# Patient Record
Sex: Female | Born: 1965 | Race: White | Hispanic: No | Marital: Married | Smoking: Never smoker
Health system: Southern US, Community
[De-identification: ages and names within clinical notes are randomized; demographics above are authoritative.]

## PROBLEM LIST (undated history)

## (undated) DIAGNOSIS — F329 Major depressive disorder, single episode, unspecified: Secondary | ICD-10-CM

## (undated) DIAGNOSIS — I10 Essential (primary) hypertension: Secondary | ICD-10-CM

## (undated) DIAGNOSIS — K219 Gastro-esophageal reflux disease without esophagitis: Secondary | ICD-10-CM

## (undated) DIAGNOSIS — J45909 Unspecified asthma, uncomplicated: Secondary | ICD-10-CM

## (undated) DIAGNOSIS — B009 Herpesviral infection, unspecified: Secondary | ICD-10-CM

## (undated) DIAGNOSIS — F419 Anxiety disorder, unspecified: Secondary | ICD-10-CM

## (undated) DIAGNOSIS — K589 Irritable bowel syndrome without diarrhea: Secondary | ICD-10-CM

## (undated) DIAGNOSIS — F32A Depression, unspecified: Secondary | ICD-10-CM

## (undated) HISTORY — PX: UPPER GASTROINTESTINAL ENDOSCOPY: SHX188

## (undated) HISTORY — DX: Irritable bowel syndrome, unspecified: K58.9

## (undated) HISTORY — DX: Herpesviral infection, unspecified: B00.9

## (undated) HISTORY — PX: WISDOM TOOTH EXTRACTION: SHX21

## (undated) HISTORY — PX: TONSILLECTOMY: SUR1361

## (undated) HISTORY — DX: Gastro-esophageal reflux disease without esophagitis: K21.9

## (undated) HISTORY — DX: Essential (primary) hypertension: I10

## (undated) HISTORY — PX: BLEPHAROPLASTY: SUR158

## (undated) HISTORY — PX: BUNIONECTOMY: SHX129

## (undated) HISTORY — DX: Unspecified asthma, uncomplicated: J45.909

## (undated) HISTORY — DX: Anxiety disorder, unspecified: F41.9

## (undated) HISTORY — DX: Depression, unspecified: F32.A

---
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
## 2002-03-03 ENCOUNTER — Inpatient Hospital Stay (HOSPITAL_COMMUNITY): Admission: EM | Admit: 2002-03-03 | Discharge: 2002-03-04 | Payer: Self-pay | Admitting: Psychiatry

## 2002-03-07 ENCOUNTER — Other Ambulatory Visit (HOSPITAL_COMMUNITY): Admission: RE | Admit: 2002-03-07 | Discharge: 2002-03-08 | Payer: Self-pay | Admitting: Psychiatry

## 2015-07-30 ENCOUNTER — Institutional Professional Consult (permissible substitution): Payer: Self-pay | Admitting: Internal Medicine

## 2015-08-15 ENCOUNTER — Institutional Professional Consult (permissible substitution): Payer: Self-pay | Admitting: Internal Medicine

## 2015-09-25 ENCOUNTER — Other Ambulatory Visit: Payer: Self-pay | Admitting: Obstetrics and Gynecology

## 2015-09-25 ENCOUNTER — Ambulatory Visit
Admission: RE | Admit: 2015-09-25 | Discharge: 2015-09-25 | Disposition: A | Discharge status: Home / Self Care | Payer: No Typology Code available for payment source | Source: Ambulatory Visit | Attending: Obstetrics and Gynecology | Admitting: Obstetrics and Gynecology

## 2015-09-25 DIAGNOSIS — R0789 Other chest pain: Secondary | ICD-10-CM

## 2015-09-25 DIAGNOSIS — R0602 Shortness of breath: Secondary | ICD-10-CM

## 2015-09-25 MED ORDER — IOPAMIDOL (ISOVUE-370) INJECTION 76%
100.0000 mL | Freq: Once | INTRAVENOUS | Status: AC | PRN
  Administered 2015-09-25: 100 mL via INTRAVENOUS

## 2015-10-01 ENCOUNTER — Telehealth: Payer: Self-pay | Admitting: Cardiology

## 2015-10-01 NOTE — Telephone Encounter (Signed)
Received records from [facility] for appointment on 10/17/15 with Dr Stanford Breed.  Records given to [facility] (medical records) for Dr Jacalyn Lefevre schedule on 10/17/15. lp

## 2015-10-11 NOTE — Progress Notes (Signed)
Referring Dr Marylynn Pearson   HPI: 50 yo female for evaluation of chest pain, dyspnea and palpitations. CTA 1/17 showed no pulmonary embolus. No prior cardiac history. Over the past 6 months she has noticed dyspnea and chest tightness. Symptoms occur with exertion and stress. Also occasionally at rest. The tightness is substernal without radiation. Not pleuritic, positional or related to food. Last 2 hours and resolved spontaneously. Resolves with a glass of wine or Xanax. No associated nausea or diaphoresis. She denies fevers, chills or hemoptysis. She did have palpitations previously but these have resolved. She had an echocardiogram with her primary care that she states was normal. She also apparently had a monitor for 7 days that was unremarkable. I do not have those results available. She has been seen by pulmonary and diagnosed with asthma. She is also scheduled for EGD. Cardiology now asked to evaluate.  Current Outpatient Prescriptions  Medication Sig Dispense Refill  . BREO ELLIPTA 100-25 MCG/INH AEPB INL 1 PUFF PO INTO THE LUNGS D  11  . buPROPion (WELLBUTRIN XL) 300 MG 24 hr tablet TK 1 T PO  D UTD  12  . busPIRone (BUSPAR) 15 MG tablet Take 15 mg by mouth 2 (two) times daily.   3  . losartan (COZAAR) 50 MG tablet Take 50 mg by mouth daily.   7  . omeprazole (PRILOSEC) 40 MG capsule TK ONE C PO QD  5  . PROAIR HFA 108 (90 Base) MCG/ACT inhaler INL 2 PFS INTO THE LUNGS Q 6 H PRF WHZ  11  . TRINESSA, 28, 0.18/0.215/0.25 MG-35 MCG tablet TK 1 T PO QD UTD  12  . valACYclovir (VALTREX) 1000 MG tablet TK 1 T PO  BID UTD  12   No current facility-administered medications for this visit.    No Known Allergies   Past Medical History  Diagnosis Date  . Asthma   . IBS (irritable bowel syndrome)   . Hypertension   . GERD (gastroesophageal reflux disease)     Past Surgical History  Procedure Laterality Date  . Bunionectomy    . Tonsillectomy      Social History   Social History   . Marital Status: Married    Spouse Name: N/A  . Number of Children: N/A  . Years of Education: N/A   Occupational History  .      Futures trader   Social History Main Topics  . Smoking status: Never Smoker   . Smokeless tobacco: Not on file  . Alcohol Use: 0.0 oz/week    0 Standard drinks or equivalent per week     Comment: 1 glass per day  . Drug Use: No  . Sexual Activity: Not on file   Other Topics Concern  . Not on file   Social History Narrative  . No narrative on file    Family History  Problem Relation Age of Onset  . Heart disease Mother   . Heart attack Mother     MI at age 58  . Hypertension Sister   . Hyperlipidemia Sister     ROS: no fevers or chills, productive cough, hemoptysis, dysphasia, odynophagia, melena, hematochezia, dysuria, hematuria, rash, seizure activity, orthopnea, PND, pedal edema, claudication. Remaining systems are negative.  Physical Exam:   Blood pressure 137/85, pulse 78, height 5' 2.5" (1.588 m), weight 126 lb 12.8 oz (57.516 kg).  General:  Well developed/well nourished in NAD Skin warm/dry Patient not depressed No peripheral clubbing Back-normal HEENT-normal/normal eyelids Neck supple/normal carotid  upstroke bilaterally; no bruits; no JVD; no thyromegaly chest - CTA/ normal expansion CV - RRR/normal S1 and S2; no murmurs, rubs or gallops;  PMI nondisplaced Abdomen -NT/ND, no HSM, no mass, + bowel sounds, no bruit 2+ femoral pulses, no bruits Ext-no edema, chords, 2+ DP Neuro-grossly nonfocal  ECG Normal sinus rhythm, right axis deviation.

## 2015-10-17 ENCOUNTER — Encounter: Payer: Self-pay | Admitting: Cardiology

## 2015-10-17 ENCOUNTER — Ambulatory Visit (INDEPENDENT_AMBULATORY_CARE_PROVIDER_SITE_OTHER): Payer: No Typology Code available for payment source | Admitting: Cardiology

## 2015-10-17 VITALS — BP 137/85 | HR 78 | Ht 62.5 in | Wt 126.8 lb

## 2015-10-17 DIAGNOSIS — R0600 Dyspnea, unspecified: Secondary | ICD-10-CM

## 2015-10-17 DIAGNOSIS — I10 Essential (primary) hypertension: Secondary | ICD-10-CM | POA: Diagnosis not present

## 2015-10-17 DIAGNOSIS — R072 Precordial pain: Secondary | ICD-10-CM

## 2015-10-17 DIAGNOSIS — R002 Palpitations: Secondary | ICD-10-CM | POA: Diagnosis not present

## 2015-10-17 DIAGNOSIS — R079 Chest pain, unspecified: Secondary | ICD-10-CM | POA: Insufficient documentation

## 2015-10-17 NOTE — Assessment & Plan Note (Signed)
Blood pressure controlled. Continue present medications. 

## 2015-10-17 NOTE — Patient Instructions (Signed)
Medication Instructions:   NO CHANGE  Testing/Procedures:  Your physician has requested that you have a stress echocardiogram. For further information please visit [email or web address]. Please follow instruction sheet as given.   Follow-Up:  Your physician recommends that you schedule a follow-up appointment in: 6-8 WEEKS WITH DR Stanford Breed   Exercise Stress Echocardiogram An exercise stress echocardiogram is a heart (cardiac) test used to check the function of your heart. This test may also be called an exercise stress echocardiography or stress echo. This stress test will check how well your heart muscle and valves are working and determine if your heart muscle is getting enough blood. You will exercise on a treadmill to naturally increase or stress the functioning of your heart.  An echocardiogram uses sound waves (ultrasound) to produce an image of your heart. If your heart does not work normally, it may indicate coronary artery disease with poor coronary blood supply. The coronary arteries are the arteries that bring blood and oxygen to your heart. LET [facility] CARE PROVIDER KNOW ABOUT:  Any allergies you have.  All medicines you are taking, including vitamins, herbs, eye drops, creams, and over-the-counter medicines.  Previous problems you or members of your family have had with the use of anesthetics.  Any blood disorders you have.  Previous surgeries you have had.  Medical conditions you have.  Possibility of pregnancy, if this applies. RISKS AND COMPLICATIONS Generally, this is a safe procedure. However, as with any procedure, complications can occur. Possible complications can include:  You develop pain or pressure in the following areas:  Chest.  Jaw or neck.  Between your shoulder blades.  Radiating down your left arm.  Dizziness or lightheadedness.  Shortness of breath.  Increased or irregular heartbeat.  Nausea or vomiting.  Heart attack  (rare). BEFORE THE PROCEDURE  Avoid all forms of caffeine for 24 hours before your test or as directed by your health care provider. This includes coffee, tea (even decaffeinated tea), caffeinated sodas, chocolate, cocoa, and certain pain medicines.  Follow your health care provider's instructions regarding eating and drinking before the test.  Take your medicines as directed at regular times with water unless instructed otherwise. Exceptions may include:  If you have diabetes, ask how you are to take your insulin or pills. It is common to adjust insulin dosing the morning of the test.  If you are taking beta-blocker medicines, it is important to talk to your health care provider about these medicines well before the date of your test. Taking beta-blocker medicines may interfere with the test. In some cases, these medicines need to be changed or stopped 24 hours or more before the test.  If you wear a nitroglycerin patch, it may need to be removed prior to the test. Ask your health care provider if the patch should be removed before the test.  If you use an inhaler for any breathing condition, bring it with you to the test.  If you are an outpatient, bring a snack so you can eat right after the stress phase of the test.  Do not smoke for 4 hours prior to the test or as directed by your health care provider.  Wear loose-fitting clothes and comfortable shoes for the test. This test involves walking on a treadmill. PROCEDURE   Multiple electrodes will be put on your chest. If needed, small areas of your chest may be shaved to get better contact with the electrodes. Once the electrodes are attached to your body, multiple wires  will be attached to the electrodes, and your heart rate will be monitored.  You will have an echocardiogram done at rest.  To produce this image of your heart, gel is applied to your chest, and a wand-like tool (transducer) is moved over the chest. The transducer sends  the sound waves through the chest to create the moving images of your heart.  You may need an IV to receive a medication that improves the quality of the pictures.  You will then walk on a treadmill. The treadmill will be started at a slow pace. The treadmill speed and incline will gradually be increased to raise your heart rate.  At the peak of exercise, the treadmill will be stopped. You will lie down immediately on a bed so that a second echocardiogram can be done to visualize your heart's motion with exercise.  The test usually takes 30-60 minutes to complete. AFTER THE PROCEDURE  Your heart rate and blood pressure will be monitored after the test.  You may return to your normal schedule, including diet, activities, and medicines, unless your health care provider tells you otherwise.   This information is not intended to replace advice given to you by your health care provider. Make sure you discuss any questions you have with your health care provider.   Document Released: 08/22/2004 Document Revised: 08/23/2013 Document Reviewed: 04/25/2013 Elsevier Interactive Patient Education [employer].

## 2015-10-17 NOTE — Assessment & Plan Note (Signed)
Etiology unclear.Not volume overloaded on examination. Previous echo normal by her report. CTA showed no pulmonary embolus. She has been diagnosed with asthma.

## 2015-10-17 NOTE — Assessment & Plan Note (Signed)
Symptoms have resolved. No further workup at this point.

## 2015-10-17 NOTE — Assessment & Plan Note (Signed)
Symptoms have both typical and atypical features. We will arrange a stress echocardiogram for risk stratification.

## 2015-10-22 ENCOUNTER — Telehealth: Payer: Self-pay | Admitting: Cardiology

## 2015-10-22 NOTE — Telephone Encounter (Signed)
Faxed Release signed by patient to [facility] - Dr Christoper Fabian to obtain records per Dr Jacalyn Lefevre request.  Faxed on 10/22/15 to [phone]. lp

## 2015-10-23 ENCOUNTER — Ambulatory Visit: Payer: No Typology Code available for payment source | Admitting: Cardiology

## 2015-10-24 ENCOUNTER — Telehealth: Payer: Self-pay | Admitting: Cardiology

## 2015-10-24 NOTE — Telephone Encounter (Signed)
Received records from [facility] as requested by Dr Stanford Breed.  Patient has appt 12/05/15 with Dr Stanford Breed.  Records given to Dr Stanford Breed to review. lp

## 2015-11-06 ENCOUNTER — Encounter (HOSPITAL_BASED_OUTPATIENT_CLINIC_OR_DEPARTMENT_OTHER): Payer: No Typology Code available for payment source

## 2015-11-06 ENCOUNTER — Ambulatory Visit (HOSPITAL_COMMUNITY): Payer: No Typology Code available for payment source | Attending: Cardiology

## 2015-11-06 DIAGNOSIS — R0600 Dyspnea, unspecified: Secondary | ICD-10-CM | POA: Diagnosis not present

## 2015-11-06 DIAGNOSIS — R079 Chest pain, unspecified: Secondary | ICD-10-CM | POA: Diagnosis present

## 2015-11-06 DIAGNOSIS — R072 Precordial pain: Secondary | ICD-10-CM

## 2015-11-06 DIAGNOSIS — R002 Palpitations: Secondary | ICD-10-CM | POA: Diagnosis not present

## 2015-11-06 DIAGNOSIS — I10 Essential (primary) hypertension: Secondary | ICD-10-CM | POA: Insufficient documentation

## 2015-11-06 DIAGNOSIS — Z8249 Family history of ischemic heart disease and other diseases of the circulatory system: Secondary | ICD-10-CM | POA: Diagnosis not present

## 2015-11-06 DIAGNOSIS — R0989 Other specified symptoms and signs involving the circulatory and respiratory systems: Secondary | ICD-10-CM

## 2015-12-05 ENCOUNTER — Ambulatory Visit: Payer: No Typology Code available for payment source | Admitting: Cardiology

## 2018-01-13 ENCOUNTER — Other Ambulatory Visit: Payer: Self-pay | Admitting: Obstetrics and Gynecology

## 2018-01-13 DIAGNOSIS — Z803 Family history of malignant neoplasm of breast: Secondary | ICD-10-CM

## 2018-03-19 ENCOUNTER — Ambulatory Visit
Admission: RE | Admit: 2018-03-19 | Discharge: 2018-03-19 | Disposition: A | Discharge status: Home / Self Care | Payer: No Typology Code available for payment source | Source: Ambulatory Visit | Attending: Obstetrics and Gynecology | Admitting: Obstetrics and Gynecology

## 2018-03-19 DIAGNOSIS — Z803 Family history of malignant neoplasm of breast: Secondary | ICD-10-CM

## 2018-03-19 MED ORDER — GADOBENATE DIMEGLUMINE 529 MG/ML IV SOLN
10.0000 mL | Freq: Once | INTRAVENOUS | Status: AC | PRN
  Administered 2018-03-19: 10 mL via INTRAVENOUS

## 2018-09-19 ENCOUNTER — Other Ambulatory Visit: Payer: Self-pay

## 2018-09-19 ENCOUNTER — Emergency Department (HOSPITAL_BASED_OUTPATIENT_CLINIC_OR_DEPARTMENT_OTHER): Payer: No Typology Code available for payment source

## 2018-09-19 ENCOUNTER — Encounter (HOSPITAL_BASED_OUTPATIENT_CLINIC_OR_DEPARTMENT_OTHER): Payer: Self-pay | Admitting: Emergency Medicine

## 2018-09-19 ENCOUNTER — Emergency Department (HOSPITAL_BASED_OUTPATIENT_CLINIC_OR_DEPARTMENT_OTHER)
Admission: EM | Admit: 2018-09-19 | Discharge: 2018-09-19 | Disposition: A | Discharge status: Home / Self Care | Payer: No Typology Code available for payment source | Attending: Emergency Medicine | Admitting: Emergency Medicine

## 2018-09-19 DIAGNOSIS — I10 Essential (primary) hypertension: Secondary | ICD-10-CM | POA: Insufficient documentation

## 2018-09-19 DIAGNOSIS — J45909 Unspecified asthma, uncomplicated: Secondary | ICD-10-CM | POA: Insufficient documentation

## 2018-09-19 DIAGNOSIS — Z79899 Other long term (current) drug therapy: Secondary | ICD-10-CM | POA: Insufficient documentation

## 2018-09-19 DIAGNOSIS — R1031 Right lower quadrant pain: Secondary | ICD-10-CM | POA: Diagnosis not present

## 2018-09-19 LAB — CBC WITH DIFFERENTIAL/PLATELET
Abs Immature Granulocytes: 0.03 10*3/uL (ref 0.00–0.07)
Basophils Absolute: 0 10*3/uL (ref 0.0–0.1)
Basophils Relative: 0 %
Eosinophils Absolute: 0 10*3/uL (ref 0.0–0.5)
Eosinophils Relative: 0 %
HCT: 41.6 % (ref 36.0–46.0)
Hemoglobin: 13.5 g/dL (ref 12.0–15.0)
IMMATURE GRANULOCYTES: 0 %
Lymphocytes Relative: 16 %
Lymphs Abs: 1.6 10*3/uL (ref 0.7–4.0)
MCH: 33.1 pg (ref 26.0–34.0)
MCHC: 32.5 g/dL (ref 30.0–36.0)
MCV: 102 fL — ABNORMAL HIGH (ref 80.0–100.0)
Monocytes Absolute: 0.5 10*3/uL (ref 0.1–1.0)
Monocytes Relative: 5 %
NEUTROS PCT: 79 %
NRBC: 0 % (ref 0.0–0.2)
Neutro Abs: 8 10*3/uL — ABNORMAL HIGH (ref 1.7–7.7)
Platelets: 275 10*3/uL (ref 150–400)
RBC: 4.08 MIL/uL (ref 3.87–5.11)
RDW: 13.2 % (ref 11.5–15.5)
WBC: 10.2 10*3/uL (ref 4.0–10.5)

## 2018-09-19 LAB — COMPREHENSIVE METABOLIC PANEL
ALT: 20 U/L (ref 0–44)
AST: 29 U/L (ref 15–41)
Albumin: 4.2 g/dL (ref 3.5–5.0)
Alkaline Phosphatase: 48 U/L (ref 38–126)
Anion gap: 12 (ref 5–15)
BUN: 12 mg/dL (ref 6–20)
CO2: 20 mmol/L — ABNORMAL LOW (ref 22–32)
Calcium: 8.9 mg/dL (ref 8.9–10.3)
Chloride: 105 mmol/L (ref 98–111)
Creatinine, Ser: 0.75 mg/dL (ref 0.44–1.00)
Glucose, Bld: 87 mg/dL (ref 70–99)
Potassium: 3.5 mmol/L (ref 3.5–5.1)
Sodium: 137 mmol/L (ref 135–145)
Total Bilirubin: 0.7 mg/dL (ref 0.3–1.2)
Total Protein: 7.1 g/dL (ref 6.5–8.1)

## 2018-09-19 LAB — URINALYSIS, ROUTINE W REFLEX MICROSCOPIC
Bilirubin Urine: NEGATIVE
Glucose, UA: NEGATIVE mg/dL
Hgb urine dipstick: NEGATIVE
Ketones, ur: NEGATIVE mg/dL
Leukocytes, UA: NEGATIVE
Nitrite: NEGATIVE
PROTEIN: NEGATIVE mg/dL
Specific Gravity, Urine: <1.005 — ABNORMAL LOW (ref 1.005–1.030)
pH: 6 (ref 5.0–8.0)

## 2018-09-19 LAB — PREGNANCY, URINE: Preg Test, Ur: NEGATIVE

## 2018-09-19 LAB — LIPASE, BLOOD: Lipase: 31 U/L (ref 11–51)

## 2018-09-19 MED ORDER — DICYCLOMINE HCL 10 MG PO CAPS
20.0000 mg | ORAL_CAPSULE | Freq: Once | ORAL | Status: AC
  Administered 2018-09-19: 20 mg via ORAL
  Filled 2018-09-19: qty 2

## 2018-09-19 MED ORDER — ONDANSETRON HCL 4 MG/2ML IJ SOLN
4.0000 mg | Freq: Once | INTRAMUSCULAR | Status: AC
  Administered 2018-09-19: 4 mg via INTRAVENOUS
  Filled 2018-09-19: qty 2

## 2018-09-19 MED ORDER — DICYCLOMINE HCL 20 MG PO TABS: 20.0000 mg | ORAL_TABLET | Freq: Two times a day (BID) | ORAL | 0 refills | Status: DC

## 2018-09-19 MED ORDER — IOPAMIDOL (ISOVUE-300) INJECTION 61%
100.0000 mL | Freq: Once | INTRAVENOUS | Status: AC | PRN
  Administered 2018-09-19: 100 mL via INTRAVENOUS

## 2018-09-19 MED ORDER — MORPHINE SULFATE (PF) 4 MG/ML IV SOLN
4.0000 mg | Freq: Once | INTRAVENOUS | Status: AC
  Administered 2018-09-19: 4 mg via INTRAVENOUS
  Filled 2018-09-19: qty 1

## 2018-09-19 MED ORDER — SODIUM CHLORIDE 0.9 % IV BOLUS
1000.0000 mL | Freq: Once | INTRAVENOUS | Status: AC
  Administered 2018-09-19: 1000 mL via INTRAVENOUS

## 2018-09-19 NOTE — Discharge Instructions (Signed)
Unclear cause of your pain but your work-up today has been very reassuring, CT scan did not show any acute changes and labs overall look good.  We were not able to view your ovaries with pelvic ultrasound due to large amounts of bowel gas, this gas could be causing her discomfort I would like for you to use Bentyl and over-the-counter gas reducers and see if this helps with your discomfort if pain is not improving please follow-up with your primary care doctor and if pain worsens or changes you have persistent vomiting, fevers or any other new or concerning symptoms do not hesitate to return to the emergency department for reevaluation.

## 2018-09-19 NOTE — ED Triage Notes (Signed)
Pt c/o right lower abdominal pain and diarrhea onset 2 am this morning. Pt seen at urgent care and sent here for further eval.

## 2018-09-19 NOTE — ED Provider Notes (Signed)
[place] EMERGENCY DEPARTMENT Provider Note   CSN: [id number] Arrival date & time: 09/19/18  1035     History   Chief Complaint Chief Complaint  Patient presents with  . Abdominal Pain  . Diarrhea    HPI Pamela Holt is a 53 y.o. female.  Pamela Holt is a 53 y.o. female with a history of IBS, hypertension, GERD and asthma, who presents to the emergency department for evaluation of right lower quadrant abdominal pain which started suddenly at 2 AM this morning.  She describes the pain as a sharp sudden onset pain that comes and goes frequently it does not last for more than a few seconds.  She is never had similar pain like this before.  Reports she had a few episodes of loose stools but has not had any nausea or vomiting.  No blood in the stool.  No fevers or chills.  Denies any dysuria or urinary frequency.  Denies any vaginal bleeding or discharge.  Reports she is on birth control and in a monogamous relationship with her husband.  She has taken some of her typical IBS medications to see if it would help with this pain but nothing seems to make the pain better or worse.  No other aggravating or alleviating factors.  No prior abdominal surgeries.  No history of kidney stones.  Patient was initially evaluated at urgent care this morning but sent to the emergency department for further evaluation.      Past Medical History:  Diagnosis Date  . Asthma   . GERD (gastroesophageal reflux disease)   . Hypertension   . IBS (irritable bowel syndrome)     Patient Active Problem List   Diagnosis Date Noted  . Chest pain 10/17/2015  . Dyspnea 10/17/2015  . Palpitations 10/17/2015  . Essential hypertension 10/17/2015    Past Surgical History:  Procedure Laterality Date  . BUNIONECTOMY    . TONSILLECTOMY       OB History   No obstetric history on file.      Home Medications    Prior to Admission medications   Medication Sig Start Date End  Date Taking? Authorizing Provider  BREO ELLIPTA 100-25 MCG/INH AEPB INL 1 PUFF PO INTO THE LUNGS D 08/30/15   [provider]  buPROPion (WELLBUTRIN XL) 300 MG 24 hr tablet TK 1 T PO  D UTD 09/24/15   [provider]  busPIRone (BUSPAR) 15 MG tablet Take 15 mg by mouth 2 (two) times daily.  09/24/15   [provider]  losartan (COZAAR) 50 MG tablet Take 50 mg by mouth daily.  09/24/15   [provider]  omeprazole (PRILOSEC) 40 MG capsule TK ONE C PO QD 09/26/15   [provider]  PROAIR HFA 108 (90 Base) MCG/ACT inhaler INL 2 PFS INTO THE LUNGS Q 6 H PRF WHZ 08/30/15   [provider]  TRINESSA, 28, 0.18/0.215/0.25 MG-35 MCG tablet TK 1 T PO QD UTD 09/26/15   [provider]  valACYclovir (VALTREX) 1000 MG tablet TK 1 T PO  BID UTD 09/24/15   [provider]    Family History Family History  Problem Relation Age of Onset  . Heart disease Mother   . Heart attack Mother        MI at age 55  . Hypertension Sister   . Hyperlipidemia Sister     Social History Social History   Tobacco Use  . Smoking status: Never Smoker  . Smokeless  tobacco: Never Used  Substance Use Topics  . Alcohol use: Yes    Alcohol/week: 0.0 standard drinks    Comment: 1 glass per day  . Drug use: No     Allergies   Patient has no known allergies.   Review of Systems Review of Systems  Constitutional: Negative for chills and fever.  HENT: Negative.   Eyes: Negative for visual disturbance.  Respiratory: Negative for cough and shortness of breath.   Cardiovascular: Negative for chest pain.  Gastrointestinal: Positive for abdominal distention and diarrhea. Negative for constipation, nausea and vomiting.  Genitourinary: Negative for dysuria, flank pain, frequency, hematuria, vaginal bleeding and vaginal discharge.  Musculoskeletal: Negative for arthralgias and myalgias.  Skin: Negative for color change and rash.  Neurological: Negative  for dizziness, syncope and light-headedness.     Physical Exam Updated Vital Signs BP (!) 171/94 (BP Location: Right Arm)   Pulse 96   Temp 98.4 F (36.9 C) (Oral)   Resp 18   Ht 5\' 2"  (1.575 m)   Wt 106.6 kg   LMP  (LMP Unknown)   SpO2 100%   BMI 42.98 kg/m   Physical Exam Vitals signs and nursing note reviewed.  Constitutional:      General: She is not in acute distress.    Appearance: She is well-developed and normal weight. She is not ill-appearing, toxic-appearing or diaphoretic.  HENT:     Head: Normocephalic and atraumatic.     Mouth/Throat:     Mouth: Mucous membranes are moist.     Pharynx: Oropharynx is clear.  Eyes:     General:        Right eye: No discharge.        Left eye: No discharge.  Cardiovascular:     Rate and Rhythm: Normal rate and regular rhythm.     Heart sounds: Normal heart sounds. No murmur. No friction rub. No gallop.   Pulmonary:     Effort: Pulmonary effort is normal. No respiratory distress.     Breath sounds: Normal breath sounds.     Comments: Respirations equal and unlabored, patient able to speak in full sentences, lungs clear to auscultation bilaterally Abdominal:     General: Abdomen is flat. Bowel sounds are normal. There is no distension.     Palpations: Abdomen is soft.     Tenderness: There is no abdominal tenderness. There is no right CVA tenderness, left CVA tenderness or guarding. Negative signs include McBurney's sign.     Hernia: No hernia is present.     Comments: Abdomen is soft, nondistended, bowel sounds present throughout, there is no reproducible tenderness in the right lower quadrant or throughout the rest of the abdomen, no guarding, rebound tenderness or peritoneal signs. No CVA tenderness bilaterally  Skin:    General: Skin is warm and dry.     Capillary Refill: Capillary refill takes less than 2 seconds.  Neurological:     Mental Status: She is alert and oriented to person, place, and time.     Coordination:  Coordination normal.  Psychiatric:        Behavior: Behavior normal.      ED Treatments / Results  Labs (all labs ordered are listed, but only abnormal results are displayed) Labs Reviewed  URINALYSIS, ROUTINE W REFLEX MICROSCOPIC - Abnormal; Notable for the following components:      Result Value   Specific Gravity, Urine <1.005 (*)    All other components within normal limits  COMPREHENSIVE METABOLIC PANEL -  Abnormal; Notable for the following components:   CO2 20 (*)    All other components within normal limits  CBC WITH DIFFERENTIAL/PLATELET - Abnormal; Notable for the following components:   MCV 102.0 (*)    Neutro Abs 8.0 (*)    All other components within normal limits  PREGNANCY, URINE  LIPASE, BLOOD    EKG None  Radiology US Transvaginal Non-ob  Result Date: 09/19/2018 CLINICAL DATA:  Right lower quadrant pain EXAM: TRANSABDOMINAL AND TRANSVAGINAL ULTRASOUND OF PELVIS TECHNIQUE: Both transabdominal and transvaginal ultrasound examinations of the pelvis were performed. Transabdominal technique was performed for global imaging of the pelvis including uterus, ovaries, adnexal regions, and pelvic cul-de-sac. It was necessary to proceed with endovaginal exam following the transabdominal exam to visualize the ovaries. COMPARISON:  CT from earlier in the same day. FINDINGS: Uterus Measurements: 8.4 x 3.7 x 4.2 cm. = volume: 68 mL. No fibroids or other mass visualized. Endometrium Thickness: 7 mm.  No focal abnormality visualized. Right ovary Not visualized Left ovary Not visualized Other findings No abnormal free fluid. IMPRESSION: Nonvisualization of the ovaries. No acute abnormality is noted. Electronically Signed   By: Inez Catalina M.D.   On: 09/19/2018 15:20   US Pelvis Complete  Result Date: 09/19/2018 CLINICAL DATA:  Right lower quadrant pain EXAM: TRANSABDOMINAL AND TRANSVAGINAL ULTRASOUND OF PELVIS TECHNIQUE: Both transabdominal and transvaginal ultrasound examinations  of the pelvis were performed. Transabdominal technique was performed for global imaging of the pelvis including uterus, ovaries, adnexal regions, and pelvic cul-de-sac. It was necessary to proceed with endovaginal exam following the transabdominal exam to visualize the ovaries. COMPARISON:  CT from earlier in the same day. FINDINGS: Uterus Measurements: 8.4 x 3.7 x 4.2 cm. = volume: 68 mL. No fibroids or other mass visualized. Endometrium Thickness: 7 mm.  No focal abnormality visualized. Right ovary Not visualized Left ovary Not visualized Other findings No abnormal free fluid. IMPRESSION: Nonvisualization of the ovaries. No acute abnormality is noted. Electronically Signed   By: Inez Catalina M.D.   On: 09/19/2018 15:20   Ct Abdomen Pelvis W Contrast  Result Date: 09/19/2018 CLINICAL DATA:  Abdominal pain.  Rule out appendicitis EXAM: CT ABDOMEN AND PELVIS WITH CONTRAST TECHNIQUE: Multidetector CT imaging of the abdomen and pelvis was performed using the standard protocol following bolus administration of intravenous contrast. CONTRAST:  162mL ISOVUE-300 IOPAMIDOL (ISOVUE-300) INJECTION 61% COMPARISON:  None. FINDINGS: Lower chest: Lung bases clear bilaterally Hepatobiliary: No focal liver abnormality is seen. No gallstones, gallbladder wall thickening, or biliary dilatation. Pancreas: Negative Spleen: Negative Adrenals/Urinary Tract: Adrenal glands are unremarkable. Kidneys are normal, without renal calculi, focal lesion, or hydronephrosis. Bladder is unremarkable. Stomach/Bowel: Negative for bowel obstruction. Edema in the gastric antrum could be due to gastritis or incomplete distention. Appendix not visualized.  No evidence of acute appendicitis Vascular/Lymphatic: No significant vascular findings are present. No enlarged abdominal or pelvic lymph nodes. Reproductive: Normal uterus.  No pelvic mass Other: Negative for free fluid.  Negative for hernia Musculoskeletal: No acute skeletal abnormality. Mild disc  degeneration L3-4 L4-5. IMPRESSION: 1. Appendix not visualized.  No evidence of acute appendicitis 2. Thickening of the gastric antrum possible gastritis. Question epigastric pain Electronically Signed   By: Franchot Gallo M.D.   On: 09/19/2018 13:21    Procedures Procedures (including critical care time)  Medications Ordered in ED Medications  sodium chloride 0.9 % bolus 1,000 mL (0 mLs Intravenous Stopped 09/19/18 1440)  ondansetron (ZOFRAN) injection 4 mg (4 mg Intravenous Given 09/19/18  1217)  morphine 4 MG/ML injection 4 mg (4 mg Intravenous Given 09/19/18 1217)  iopamidol (ISOVUE-300) 61 % injection 100 mL (100 mLs Intravenous Contrast Given 09/19/18 1247)  dicyclomine (BENTYL) capsule 20 mg (20 mg Oral Given 09/19/18 1529)     Initial Impression / Assessment and Plan / ED Course  I have reviewed the triage vital signs and the nursing notes.  Pertinent labs & imaging results that were available during my care of the patient were reviewed by me and considered in my medical decision making (see chart for details).  Patient presents to the emergency department for evaluation of right lower quadrant abdominal pain which began suddenly at 2 AM.  Pains are intermittent, sharp and brief.  On arrival patient is hypertensive but all other vitals normal and she appears comfortable and is in no acute distress, not experiencing pain at time of my exam and abdominal exam with no reproducible tenderness, no guarding or rebound and no CVA tenderness.  Patient was sent from urgent care for further evaluation of pain with concern for possible appendicitis.  My differential includes appendicitis, diverticulitis, patient does have history of IBS and pain could be related to this, versus ovarian torsion although patient has no history of ovarian cysts.  Will start with basic abdominal labs and CT abdomen pelvis.  Fluids, Zofran and morphine given for symptomatic management.  Work-up overall reassuring, no  leukocytosis, normal hemoglobin, no acute electrolyte derangements requiring intervention, normal renal liver function and normal lipase.  Negative pregnancy, urinalysis without hematuria to suggest infection or kidney stone and no other signs of infection.  CT abdomen pelvis without any evidence of appendicitis or other acute intra-abdominal pathology, there is some thickening of the gastric antrum but patient is not having any epigastric pain or tenderness on exam and is not experiencing any signs of gastritis.  No other acute findings within the abdomen or pelvis.  Given that patient is still experiencing this intermittent pain will proceed with pelvic ultrasound to assess for any ovarian cyst although I feel that torsion is unlikely since pain is intermittent and brief rather than constant and worsening.  Pelvic ultrasound on not able to visualize ovaries due to large amounts of bowel gas but no other acute findings noted within the pelvis, normal uterus.  Again I have low suspicion for ovarian torsion given that pain is brief and intermittent and given the amount of bowel gas the ultrasound tech described I feel that this may be contributing to patient's symptoms.  Will treat with Bentyl and I have encouraged patient to use gas reducer such as simethicone or Gas-X and have patient follow-up closely with her primary care doctor if symptoms are not improving, strict return precautions discussed.  Patient expresses understanding and agreement with plan.  Final Clinical Impressions(s) / ED Diagnoses   Final diagnoses:  Right lower quadrant abdominal pain    ED Discharge Orders         Ordered    dicyclomine (BENTYL) 20 MG tablet  2 times daily     09/19/18 1534           Benedetto Goad [place], [place] 09/22/18 [id number]    Virgel Manifold, MD 09/24/18 [phone]

## 2019-01-31 ENCOUNTER — Encounter: Payer: Self-pay | Admitting: Gastroenterology

## 2019-02-14 ENCOUNTER — Ambulatory Visit (AMBULATORY_SURGERY_CENTER): Payer: Self-pay | Admitting: *Deleted

## 2019-02-14 ENCOUNTER — Other Ambulatory Visit: Payer: Self-pay

## 2019-02-14 VITALS — Ht 62.0 in | Wt 125.0 lb

## 2019-02-14 DIAGNOSIS — Z1211 Encounter for screening for malignant neoplasm of colon: Secondary | ICD-10-CM

## 2019-02-14 MED ORDER — SUPREP BOWEL PREP KIT 17.5-3.13-1.6 GM/177ML PO SOLN: 1.0000 | Freq: Once | ORAL | 0 refills | Status: AC

## 2019-02-14 NOTE — Progress Notes (Signed)
Patient denies any allergies to egg or soy products. Patient denies complications with anesthesia/sedation.  Patient denies oxygen use at home and denies diet medications.   Pt verified name, DOB, address and insurance during PV today. Pt mailed instruction packet to included paper to complete and mail back to [facility] with addressed and stamped envelope, Emmi video, copy of consent form to read and not return, and instructions. Suprep coupon mailed in packet. PV completed over the phone. Pt encouraged to call with questions or issues after reviewing the packet information.

## 2019-02-17 ENCOUNTER — Encounter: Payer: Self-pay | Admitting: Gastroenterology

## 2019-02-21 ENCOUNTER — Telehealth: Payer: Self-pay

## 2019-02-21 NOTE — Telephone Encounter (Signed)
Called patient and left message on voice mail confirming instructions for amount of Suprep to take in divided doses.( 6oz. Evening before procedure and 6oz the morning of the Colonscopy)

## 2019-02-24 ENCOUNTER — Telehealth: Payer: Self-pay | Admitting: Gastroenterology

## 2019-02-24 NOTE — Telephone Encounter (Signed)
Left message to call back to ask Covid-19 screening questions. °Covid-19 Screening Questions: ° °Do you now or have you had a fever in the last 14 days?  ° °Do you have any respiratory symptoms of shortness of breath or cough now or in the last 14 days?  ° °Do you have any family members or close contacts with diagnosed or suspected Covid-19 in the past 14 days?  ° °Have you been tested for Covid-19 and found to be positive?  ° °Pt made aware of that care partner may wait in the car or come up to the lobby during the procedure but will need to provide their own mask. °

## 2019-02-24 NOTE — Telephone Encounter (Signed)
Pt returned call:  Covid-19 Screening Questions   Do you now or have you had a fever in the last 14 days?     No   Do you have any respiratory symptoms of shortness of breath or cough now or in the last 14 days?    No   Do you have any family members or close contacts with diagnosed or suspected Covid-19 in the past 14 days?  No   Have you been tested for Covid-19 and found to be positive?    No   Pt made aware of that care partner may come to the lobby during the procedure but will need to provide their own mask.

## 2019-02-25 ENCOUNTER — Other Ambulatory Visit: Payer: Self-pay

## 2019-02-25 ENCOUNTER — Ambulatory Visit (AMBULATORY_SURGERY_CENTER): Payer: No Typology Code available for payment source | Admitting: Gastroenterology

## 2019-02-25 ENCOUNTER — Encounter: Payer: Self-pay | Admitting: Gastroenterology

## 2019-02-25 VITALS — BP 117/75 | HR 75 | Temp 98.5°F | Resp 15 | Ht 62.25 in | Wt 126.0 lb

## 2019-02-25 DIAGNOSIS — D124 Benign neoplasm of descending colon: Secondary | ICD-10-CM | POA: Diagnosis not present

## 2019-02-25 DIAGNOSIS — Z1211 Encounter for screening for malignant neoplasm of colon: Secondary | ICD-10-CM | POA: Diagnosis not present

## 2019-02-25 MED ORDER — CILIDINIUM-CHLORDIAZEPOXIDE 2.5-5 MG PO CAPS: 1.0000 | ORAL_CAPSULE | Freq: Every day | ORAL | 0 refills | Status: AC | PRN

## 2019-02-25 MED ORDER — SODIUM CHLORIDE 0.9 % IV SOLN: 500.0000 mL | Freq: Once | INTRAVENOUS | Status: DC

## 2019-02-25 NOTE — Patient Instructions (Signed)
YOU HAD AN ENDOSCOPIC PROCEDURE TODAY AT THE [facility]:   Refer to the procedure report that was given to you for any specific questions about what was found during the examination.  If the procedure report does not answer your questions, please call your gastroenterologist to clarify.  If you requested that your care partner not be given the details of your procedure findings, then the procedure report has been included in a sealed envelope for you to review at your convenience later.  YOU SHOULD EXPECT: Some feelings of bloating in the abdomen. Passage of more gas than usual.  Walking can help get rid of the air that was put into your GI tract during the procedure and reduce the bloating. If you had a lower endoscopy (such as a colonoscopy or flexible sigmoidoscopy) you may notice spotting of blood in your stool or on the toilet paper. If you underwent a bowel prep for your procedure, you may not have a normal bowel movement for a few days.  Please Note:  You might notice some irritation and congestion in your nose or some drainage.  This is from the oxygen used during your procedure.  There is no need for concern and it should clear up in a day or so.  SYMPTOMS TO REPORT IMMEDIATELY:   Following lower endoscopy (colonoscopy or flexible sigmoidoscopy):  Excessive amounts of blood in the stool  Significant tenderness or worsening of abdominal pains  Swelling of the abdomen that is new, acute  Fever of 100F or higher   For urgent or emergent issues, a gastroenterologist can be reached at any hour by calling [phone].   DIET:  We do recommend a small meal at first, but then you may proceed to your regular diet.  Drink plenty of fluids but you should avoid alcoholic beverages for 24 hours.  MEDICATIONS: Continue present medications.  Please see handouts given to you by your recovery nurse.  ACTIVITY:  You should plan to take it easy for the rest of today and you should  NOT DRIVE or use heavy machinery until tomorrow (because of the sedation medicines used during the test).    FOLLOW UP: Our staff will call the number listed on your records 48-72 hours following your procedure to check on you and address any questions or concerns that you may have regarding the information given to you following your procedure. If we do not reach you, we will leave a message.  We will attempt to reach you two times.  During this call, we will ask if you have developed any symptoms of COVID 19. If you develop any symptoms (ie: fever, flu-like symptoms, shortness of breath, cough etc.) before then, please call [phone].  If you test positive for Covid 19 in the 2 weeks post procedure, please call and report this information to us.    If any biopsies were taken you will be contacted by phone or by letter within the next 1-3 weeks.  Please call us at [phone] if you have not heard about the biopsies in 3 weeks.   Thank you for allowing us to provide for your healthcare needs today.   SIGNATURES/CONFIDENTIALITY: You and/or your care partner have signed paperwork which will be entered into your electronic medical record.  These signatures attest to the fact that that the information above on your After Visit Summary has been reviewed and is understood.  Full responsibility of the confidentiality of this discharge information lies with you and/or   your care-partner. 

## 2019-02-25 NOTE — Progress Notes (Signed)
Called to room to assist during endoscopic procedure.  Patient ID and intended procedure confirmed with present staff. Received instructions for my participation in the procedure from the performing physician.  

## 2019-02-25 NOTE — Progress Notes (Signed)
Pt's states no medical or surgical changes since previsit or office visit.  Temp CW Vitals JB 

## 2019-02-25 NOTE — Progress Notes (Signed)
Pt drowsy. VSS. Report given to RN. No anesthetic complications noted. 

## 2019-02-25 NOTE — Op Note (Signed)
[place] Patient Name: Pamela Holt Procedure Date: 02/25/2019 10:53 AM MRN: [id number] Endoscopist: Mauri Pole , MD Age: 53 Referring MD:  Date of Birth: 07-18-1966 Gender: Female Account #: [id number] Procedure:                Colonoscopy Indications:              Screening for colorectal malignant neoplasm Medicines:                Monitored Anesthesia Care Procedure:                Pre-Anesthesia Assessment:                           - Prior to the procedure, a History and Physical                            was performed, and patient medications and                            allergies were reviewed. The patient's tolerance of                            previous anesthesia was also reviewed. The risks                            and benefits of the procedure and the sedation                            options and risks were discussed with the patient.                            All questions were answered, and informed consent                            was obtained. Prior Anticoagulants: The patient has                            taken no previous anticoagulant or antiplatelet                            agents. ASA Grade Assessment: II - A patient with                            mild systemic disease. After reviewing the risks                            and benefits, the patient was deemed in                            satisfactory condition to undergo the procedure.                           After obtaining informed consent, the colonoscope  was passed under direct vision. Throughout the                            procedure, the patient's blood pressure, pulse, and                            oxygen saturations were monitored continuously. The                            Colonoscope was introduced through the anus and                            advanced to the the terminal ileum, with   identification of the appendiceal orifice and IC                            valve. The colonoscopy was performed without                            difficulty. The patient tolerated the procedure                            well. The quality of the bowel preparation was                            excellent. The ileocecal valve, appendiceal                            orifice, and rectum were photographed. Scope In: 11:05:19 AM Scope Out: 11:23:45 AM Scope Withdrawal Time: 0 hours 9 minutes 26 seconds  Total Procedure Duration: 0 hours 18 minutes 26 seconds  Findings:                 The perianal and digital rectal examinations were                            normal.                           A 5 mm polyp was found in the descending colon. The                            polyp was sessile. The polyp was removed with a                            cold snare. Resection and retrieval were complete.                           Multiple small-mouthed diverticula were found in                            the sigmoid colon. There was no evidence of                            diverticular bleeding.  Non-bleeding internal hemorrhoids were found during                            retroflexion. The hemorrhoids were small. Complications:            No immediate complications. Estimated Blood Loss:     Estimated blood loss was minimal. Impression:               - One 5 mm polyp in the descending colon, removed                            with a cold snare. Resected and retrieved.                           - Moderate diverticulosis in the sigmoid colon.                            There was no evidence of diverticular bleeding.                           - Non-bleeding internal hemorrhoids. Recommendation:           - Patient has a contact number available for                            emergencies. The signs and symptoms of potential                            delayed complications were  discussed with the                            patient. Return to normal activities tomorrow.                            Written discharge instructions were provided to the                            patient.                           - Resume previous diet.                           - Continue present medications.                           - Await pathology results.                           - Repeat colonoscopy in 5-10 years for surveillance                            based on pathology results. Mauri Pole, MD 02/25/2019 11:28:56 AM This report has been signed electronically.

## 2019-03-01 ENCOUNTER — Telehealth: Payer: Self-pay

## 2019-03-01 ENCOUNTER — Telehealth: Payer: Self-pay | Admitting: *Deleted

## 2019-03-01 NOTE — Telephone Encounter (Signed)
  Follow up Call-  Call back number 02/25/2019  Post procedure Call Back phone  # [phone] cell  Permission to leave phone message Yes  Some recent data might be hidden     Patient questions:  Do you have a fever, pain , or abdominal swelling? No. Pain Score  0 *  Have you tolerated food without any problems? Yes.    Have you been able to return to your normal activities? Yes.    Do you have any questions about your discharge instructions: Diet   No. Medications  No. Follow up visit  No.  Do you have questions or concerns about your Care? No.  Actions: * If pain score is 4 or above: No action needed, pain <4.  1. Have you developed a fever since your procedure? no  2.   Have you had an respiratory symptoms (SOB or cough) since your procedure? no  3.   Have you tested positive for COVID 19 since your procedure no  4.   Have you had any family members/close contacts diagnosed with the COVID 19 since your procedure?  no   If yes to any of these questions please route to Joylene John, RN and Alphonsa Gin, Therapist, sports.

## 2019-03-01 NOTE — Telephone Encounter (Signed)
  Follow up Call-  Call back number 02/25/2019  Post procedure Call Back phone  # [phone] cell  Permission to leave phone message Yes  Some recent data might be hidden     Left message Will try again midday

## 2019-03-11 ENCOUNTER — Encounter: Payer: Self-pay | Admitting: Gastroenterology

## 2019-12-23 ENCOUNTER — Other Ambulatory Visit: Payer: Self-pay

## 2019-12-23 ENCOUNTER — Ambulatory Visit (INDEPENDENT_AMBULATORY_CARE_PROVIDER_SITE_OTHER): Payer: No Typology Code available for payment source | Admitting: Gastroenterology

## 2019-12-23 ENCOUNTER — Encounter: Payer: Self-pay | Admitting: Gastroenterology

## 2019-12-23 ENCOUNTER — Other Ambulatory Visit (INDEPENDENT_AMBULATORY_CARE_PROVIDER_SITE_OTHER): Payer: No Typology Code available for payment source

## 2019-12-23 VITALS — BP 154/88 | HR 80 | Temp 98.3°F | Ht 62.0 in | Wt 135.0 lb

## 2019-12-23 DIAGNOSIS — R7989 Other specified abnormal findings of blood chemistry: Secondary | ICD-10-CM

## 2019-12-23 DIAGNOSIS — R110 Nausea: Secondary | ICD-10-CM

## 2019-12-23 DIAGNOSIS — R1013 Epigastric pain: Secondary | ICD-10-CM | POA: Diagnosis not present

## 2019-12-23 DIAGNOSIS — K219 Gastro-esophageal reflux disease without esophagitis: Secondary | ICD-10-CM

## 2019-12-23 DIAGNOSIS — R140 Abdominal distension (gaseous): Secondary | ICD-10-CM

## 2019-12-23 DIAGNOSIS — K5904 Chronic idiopathic constipation: Secondary | ICD-10-CM

## 2019-12-23 DIAGNOSIS — K5909 Other constipation: Secondary | ICD-10-CM

## 2019-12-23 LAB — CBC WITH DIFFERENTIAL/PLATELET
Basophils Absolute: 0.1 10*3/uL (ref 0.0–0.1)
Basophils Relative: 0.8 % (ref 0.0–3.0)
Eosinophils Absolute: 0.1 10*3/uL (ref 0.0–0.7)
Eosinophils Relative: 1.6 % (ref 0.0–5.0)
HCT: 41.3 % (ref 36.0–46.0)
Hemoglobin: 14 g/dL (ref 12.0–15.0)
Lymphocytes Relative: 28.3 % (ref 12.0–46.0)
Lymphs Abs: 2.1 10*3/uL (ref 0.7–4.0)
MCHC: 33.9 g/dL (ref 30.0–36.0)
MCV: 100.9 fl — ABNORMAL HIGH (ref 78.0–100.0)
Monocytes Absolute: 0.7 10*3/uL (ref 0.1–1.0)
Monocytes Relative: 8.7 % (ref 3.0–12.0)
Neutro Abs: 4.5 10*3/uL (ref 1.4–7.7)
Neutrophils Relative %: 60.6 % (ref 43.0–77.0)
Platelets: 261 10*3/uL (ref 150.0–400.0)
RBC: 4.09 Mil/uL (ref 3.87–5.11)
RDW: 13.9 % (ref 11.5–15.5)
WBC: 7.5 10*3/uL (ref 4.0–10.5)

## 2019-12-23 LAB — COMPREHENSIVE METABOLIC PANEL
ALT: 26 U/L (ref 0–35)
AST: 28 U/L (ref 0–37)
Albumin: 4.1 g/dL (ref 3.5–5.2)
Alkaline Phosphatase: 56 U/L (ref 39–117)
BUN: 15 mg/dL (ref 6–23)
CO2: 28 mEq/L (ref 19–32)
Calcium: 9.5 mg/dL (ref 8.4–10.5)
Chloride: 102 mEq/L (ref 96–112)
Creatinine, Ser: 0.83 mg/dL (ref 0.40–1.20)
GFR: 72.25 mL/min (ref >60.00)
Glucose, Bld: 91 mg/dL (ref 70–99)
Potassium: 4.4 mEq/L (ref 3.5–5.1)
Sodium: 138 mEq/L (ref 135–145)
Total Bilirubin: 0.4 mg/dL (ref 0.2–1.2)
Total Protein: 6.8 g/dL (ref 6.0–8.3)

## 2019-12-23 MED ORDER — DICYCLOMINE HCL 20 MG PO TABS: 20.0000 mg | ORAL_TABLET | Freq: Four times a day (QID) | ORAL | 0 refills | Status: DC | PRN

## 2019-12-23 MED ORDER — DEXILANT 60 MG PO CPDR: 60.0000 mg | DELAYED_RELEASE_CAPSULE | Freq: Every day | ORAL | 0 refills | Status: AC

## 2019-12-23 NOTE — Patient Instructions (Signed)
If you are age 52 or older, your body mass index should be between 23-30. Your Body mass index is 24.69 kg/m. If this is out of the aforementioned range listed, please consider follow up with your Primary Care Provider.  If you are age 50 or younger, your body mass index should be between 19-25. Your Body mass index is 24.69 kg/m. If this is out of the aformentioned range listed, please consider follow up with your Primary Care Provider.   You have been scheduled for an endoscopy. Please follow written instructions given to you at your visit today. If you use inhalers (even only as needed), please bring them with you on the day of your procedure. Your physician has requested that you go to www.startemmi.com and enter the access code given to you at your visit today. This web site gives a general overview about your procedure. However, you should still follow specific instructions given to you by our office regarding your preparation for the procedure.  Your provider has requested that you go to the basement level for lab work before leaving today. Press "B" on the elevator. The lab is located at the first door on the left as you exit the elevator.  We have sent the following medications to your pharmacy for you to pick up at your convenience: [place] have been scheduled for an abdominal ultrasound at [facility] (1st floor of hospital) on 12/29/19 at 8 am. Please arrive 15 minutes prior to your appointment for registration. Make certain not to have anything to eat or drink 6 hours prior to your appointment. Should you need to reschedule your appointment, please contact radiology at [phone]. This test typically takes about 30 minutes to perform.  You have been given anti-reflux measures.  I appreciate the opportunity to care for you.  Thank you for choosing me and [facility].   Harl Bowie, MD

## 2019-12-23 NOTE — Progress Notes (Signed)
Pamela Holt    JT:[id number]    Jun 18, 1966  Primary Care Physician:Pamela Holt., MD  Referring Physician: Loraine Holt., [place],  [place] [id number]   Chief complaint: Epigastric abdominal pain, bloating  HPI:  54 yr F here for follow up visit with c/o upper abdominal pain and discomfort, has constant bloating.  Discomfort worse after meals.   Irregular bowel habits, always had constipation, is getting worse. She is having BM every 2-3 days with  She has tried GasX, over-the-counter diuretic, Midol, apple cider vinegar, peppermint and ginger, has had no relief  Has intermittent nausea  No change in appetite or weight loss  Has partially torn rotator cuff.  She takes daily NSAIDs naproxen twice daily, Tylenol 2gm and also drinks 1 to 2 glasses of wine every night  Jan 2021 had acute right-sided rib pain, was diagnosed with costochondritis and was given a short course of steroids by her primary care physician  She recently did cool sculpting  Sessions X2   Colonoscopy February 25, 2019: 5 mm polyp removed from descending colon.  Diverticulosis and internal hemorrhoids.  CT abdomen and pelvis September 19, 2018: Thickening of gastric antrum possible gastritis otherwise unremarkable exam.  Pelvic ultrasound September 19, 2018: No acute abnormality.  Outpatient Encounter Medications as of 12/23/2019  Medication Sig  . acetaminophen (TYLENOL) 500 MG tablet Take 1,000 mg by mouth daily.  Marland Kitchen ALPRAZolam (XANAX) 0.25 MG tablet TK 1 T PO  PRN  . buPROPion (WELLBUTRIN XL) 300 MG 24 hr tablet TK 1 T PO  D UTD  . clidinium-chlordiazePOXIDE (LIBRAX) 5-2.5 MG capsule Take 1 capsule by mouth daily as needed.  . famotidine (PEPCID) 20 MG tablet Take 20 mg by mouth at bedtime.  Marland Kitchen losartan (COZAAR) 50 MG tablet Take 50 mg by mouth daily.   . Multiple Vitamin (MULTIVITAMIN WITH MINERALS) TABS tablet Take 1 tablet by mouth daily.  . naproxen  sodium (ALEVE) 220 MG tablet Take 220 mg by mouth 2 (two) times daily as needed.  . Omega-3 1000 MG CAPS Take 2,000 mg by mouth.   Marland Kitchen omeprazole (PRILOSEC) 40 MG capsule TK ONE C PO QD  . PROAIR HFA 108 (90 Base) MCG/ACT inhaler INL 2 PFS INTO THE LUNGS Q 6 H PRF WHZ  . TRINESSA, 28, 0.18/0.215/0.25 MG-35 MCG tablet TK 1 T PO QD UTD  . valACYclovir (VALTREX) 1000 MG tablet TK 1 T PO  BID UTD  . [DISCONTINUED] BREO ELLIPTA 100-25 MCG/INH AEPB INL 1 PUFF PO INTO THE LUNGS D  . [DISCONTINUED] ciprofloxacin (CILOXAN) 0.3 % ophthalmic solution INT 1 GTT IN OU BID FOR 2 WEEKS  . [DISCONTINUED] dicyclomine (BENTYL) 20 MG tablet Take 1 tablet (20 mg total) by mouth 2 (two) times daily. (Patient not taking: Reported on 02/14/2019)  . [DISCONTINUED] erythromycin ophthalmic ointment USE IN OU QHS FOR 1 MONTH   Facility-Administered Encounter Medications as of 12/23/2019  Medication  . 0.9 %  sodium chloride infusion    Allergies as of 12/23/2019  . (No Known Allergies)    Past Medical History:  Diagnosis Date  . Anxiety   . Asthma   . Depression   . GERD (gastroesophageal reflux disease)   . HSV-2 (herpes simplex virus 2) infection   . Hypertension   . IBS (irritable bowel syndrome)     Past Surgical History:  Procedure Laterality Date  . BLEPHAROPLASTY  x 2  . BUNIONECTOMY    . TONSILLECTOMY    . UPPER GASTROINTESTINAL ENDOSCOPY     x 2   . WISDOM TOOTH EXTRACTION      Family History  Problem Relation Age of Onset  . Heart disease Mother   . Heart attack Mother        MI at age 38  . Hypertension Sister   . Hyperlipidemia Sister   . Colon cancer Neg Hx   . Colon polyps Neg Hx   . Esophageal cancer Neg Hx   . Rectal cancer Neg Hx   . Stomach cancer Neg Hx     Social History   Socioeconomic History  . Marital status: Married    Spouse name: Not on file  . Number of children: Not on file  . Years of education: Not on file  . Highest education level: Not on file    Occupational History    Comment: Futures trader  Tobacco Use  . Smoking status: Never Smoker  . Smokeless tobacco: Never Used  Substance and Sexual Activity  . Alcohol use: Yes    Alcohol/week: 7.0 - 14.0 standard drinks    Types: 7 - 14 Glasses of wine per week    Comment: 1-2 glasses per day with dinner  . Drug use: No  . Sexual activity: Not on file  Other Topics Concern  . Not on file  Social History Narrative  . Not on file   Social Determinants of Health   Financial Resource Strain:   . Difficulty of Paying Living Expenses:   Food Insecurity:   . Worried About Charity fundraiser in the Last Year:   . Arboriculturist in the Last Year:   Transportation Needs:   . Film/video editor (Medical):   Marland Kitchen Lack of Transportation (Non-Medical):   Physical Activity:   . Days of Exercise per Week:   . Minutes of Exercise per Session:   Stress:   . Feeling of Stress :   Social Connections:   . Frequency of Communication with Friends and Family:   . Frequency of Social Gatherings with Friends and Family:   . Attends Religious Services:   . Active Member of Clubs or Organizations:   . Attends Archivist Meetings:   Marland Kitchen Marital Status:   Intimate Partner Violence:   . Fear of Current or Ex-Partner:   . Emotionally Abused:   Marland Kitchen Physically Abused:   . Sexually Abused:       Review of systems: All other review of systems negative except as mentioned in the HPI.   Physical Exam: Vitals:   12/23/19 1318  BP: (!) 154/88  Pulse: 80  Temp: 98.3 F (36.8 C)   Body mass index is 24.69 kg/m. Gen:      No acute distress Abd:       soft, non-tender; no palpable masses, no distension Neuro: alert and oriented x 3 Psych: normal mood and affect  Data Reviewed:  Reviewed labs, radiology imaging, old records and pertinent past GI work up   Assessment and Plan/Recommendations:  54 year old very pleasant female here with complaints of epigastric abdominal  pain, discomfort and bloating  Evidence of antral gastric wall thickening on CT abdomen pelvis in January 2020.  She has been taking daily NSAIDs for almost a year now We will schedule EGD to evaluate, exclude NSAIDs induced gastritis, gastroduodenal ulcer or peptic ulcer disease Also exclude H. Pylori  Advised patient to  limit use of NSAIDs  Epigastric and right upper quadrant pain: We will need to exclude gallbladder disease/fatty liver S/p cool sculpting, possible liver injury Obtain right upper quadrant ultrasound Check CBC and CMP Avoid alcohol use  GERD: Currently taking Prilosec 40 mg daily and Pepcid at bedtime Switch to Dexilant 60 mg daily, persistent symptoms Antireflux measures  IBS, abdominal bloating and discomfort: Use dicyclomine 20 mg every 6 hours as needed    The patient was provided an opportunity to ask questions and all were answered. The patient agreed with the plan and demonstrated an understanding of the instructions.  Damaris Hippo , MD    CC: Pamela Holt.,*

## 2019-12-26 ENCOUNTER — Other Ambulatory Visit (INDEPENDENT_AMBULATORY_CARE_PROVIDER_SITE_OTHER): Payer: No Typology Code available for payment source

## 2019-12-26 DIAGNOSIS — R7989 Other specified abnormal findings of blood chemistry: Secondary | ICD-10-CM | POA: Diagnosis not present

## 2019-12-26 LAB — B12 AND FOLATE PANEL
Folate: >23.7 ng/mL (ref >5.9)
Vitamin B-12: 687 pg/mL (ref 211–911)

## 2019-12-29 ENCOUNTER — Ambulatory Visit (HOSPITAL_COMMUNITY)
Admission: RE | Admit: 2019-12-29 | Discharge: 2019-12-29 | Disposition: A | Discharge status: Home / Self Care | Payer: No Typology Code available for payment source | Source: Ambulatory Visit | Attending: Gastroenterology | Admitting: Gastroenterology

## 2019-12-29 ENCOUNTER — Other Ambulatory Visit: Payer: Self-pay

## 2019-12-29 DIAGNOSIS — K219 Gastro-esophageal reflux disease without esophagitis: Secondary | ICD-10-CM

## 2019-12-29 DIAGNOSIS — R140 Abdominal distension (gaseous): Secondary | ICD-10-CM | POA: Diagnosis present

## 2019-12-29 DIAGNOSIS — R110 Nausea: Secondary | ICD-10-CM | POA: Diagnosis present

## 2019-12-29 DIAGNOSIS — R1013 Epigastric pain: Secondary | ICD-10-CM

## 2020-01-02 ENCOUNTER — Encounter: Payer: Self-pay | Admitting: Gastroenterology

## 2020-01-04 ENCOUNTER — Other Ambulatory Visit: Payer: Self-pay

## 2020-01-04 ENCOUNTER — Encounter: Payer: Self-pay | Admitting: Gastroenterology

## 2020-01-04 ENCOUNTER — Ambulatory Visit (AMBULATORY_SURGERY_CENTER): Payer: No Typology Code available for payment source | Admitting: Gastroenterology

## 2020-01-04 VITALS — BP 135/75 | HR 72 | Temp 97.1°F | Resp 13 | Ht 62.0 in | Wt 135.0 lb

## 2020-01-04 DIAGNOSIS — R1013 Epigastric pain: Secondary | ICD-10-CM

## 2020-01-04 DIAGNOSIS — K449 Diaphragmatic hernia without obstruction or gangrene: Secondary | ICD-10-CM

## 2020-01-04 DIAGNOSIS — K2950 Unspecified chronic gastritis without bleeding: Secondary | ICD-10-CM | POA: Diagnosis present

## 2020-01-04 DIAGNOSIS — K2900 Acute gastritis without bleeding: Secondary | ICD-10-CM

## 2020-01-04 DIAGNOSIS — K2980 Duodenitis without bleeding: Secondary | ICD-10-CM | POA: Diagnosis not present

## 2020-01-04 MED ORDER — SODIUM CHLORIDE 0.9 % IV SOLN: 500.0000 mL | Freq: Once | INTRAVENOUS | Status: AC

## 2020-01-04 MED ORDER — OMEPRAZOLE 40 MG PO CPDR: DELAYED_RELEASE_CAPSULE | ORAL | 3 refills | Status: AC

## 2020-01-04 NOTE — Progress Notes (Signed)
Pt's states no medical or surgical changes since previsit or office visit.  Temp- June Vitals- Courtney 

## 2020-01-04 NOTE — Op Note (Signed)
[facility] Patient Name: Pamela Holt Procedure Date: 01/04/2020 9:41 AM MRN: [id number] Endoscopist: Mauri Pole , MD Age: 54 Referring MD:  Date of Birth: 20-Sep-1965 Gender: Female Account #: [id number] Procedure:                Upper GI endoscopy Indications:              Epigastric abdominal pain, Abdominal bloating Medicines:                Monitored Anesthesia Care Procedure:                Pre-Anesthesia Assessment:                           - Prior to the procedure, a History and Physical                            was performed, and patient medications and                            allergies were reviewed. The patient's tolerance of                            previous anesthesia was also reviewed. The risks                            and benefits of the procedure and the sedation                            options and risks were discussed with the patient.                            All questions were answered, and informed consent                            was obtained. Prior Anticoagulants: The patient has                            taken no previous anticoagulant or antiplatelet                            agents. ASA Grade Assessment: II - A patient with                            mild systemic disease. After reviewing the risks                            and benefits, the patient was deemed in                            satisfactory condition to undergo the procedure.                           After obtaining informed consent, the endoscope was  passed under direct vision. Throughout the                            procedure, the patient's blood pressure, pulse, and                            oxygen saturations were monitored continuously. The                            Endoscope was introduced through the mouth, and                            advanced to the second part of duodenum. The upper      GI endoscopy was accomplished without difficulty.                            The patient tolerated the procedure well. Scope In: Scope Out: Findings:                 The Z-line was regular and was found 35 cm from the                            incisors.                           No gross lesions were noted in the entire esophagus.                           The gastroesophageal flap valve was visualized                            endoscopically and classified as Hill Grade III                            (minimal fold, loose to endoscope, hiatal hernia                            likely).                           Scattered mild inflammation characterized by                            congestion (edema) and erythema was found in the                            entire examined stomach. Biopsies were taken with a                            cold forceps for Helicobacter pylori testing.                           Patchy mildly erythematous mucosa was found in the  first portion of the duodenum and in the second                            portion of the duodenum. Complications:            No immediate complications. Estimated Blood Loss:     Estimated blood loss was minimal. Impression:               - Z-line regular, 35 cm from the incisors.                           - No gross lesions in esophagus.                           - Gastroesophageal flap valve classified as Hill                            Grade III (minimal fold, loose to endoscope, hiatal                            hernia likely).                           - Gastritis. Biopsied.                           - Erythematous duodenopathy. Recommendation:           - Patient has a contact number available for                            emergencies. The signs and symptoms of potential                            delayed complications were discussed with the                            patient. Return to normal  activities tomorrow.                            Written discharge instructions were provided to the                            patient.                           - Resume previous diet.                           - Continue present medications.                           - Await pathology results.                           - Return to GI clinic in 4-6 months for follow.  Please call [phone] to schedule follow up                            appointment                           - Follow an antireflux regimen.                           - Use Prilosec (omeprazole) 40 mg PO BID for 3                            months, decrease to once daily after that. Mauri Pole, MD 01/04/2020 10:04:21 AM This report has been signed electronically.

## 2020-01-04 NOTE — Progress Notes (Signed)
Called to room to assist during endoscopic procedure.  Patient ID and intended procedure confirmed with present staff. Received instructions for my participation in the procedure from the performing physician.  

## 2020-01-04 NOTE — Patient Instructions (Addendum)
Await biopsy results from Dr Silverio Decamp  Handout on Gastritis given to you today  Handout on Reflux ( anti reflux regimen) given to you   Schedule an appointment to see Dr Silverio Decamp back in GI clinic in 4- 6 months  follow anti - reflux regimen   Prilosec 40 mg by mouth twice daily for 3 months,then decrease to once daily after that- sent to [facility]:   Refer to the procedure report that was given to you for any specific questions about what was found during the examination.  If the procedure report does not answer your questions, please call your gastroenterologist to clarify.  If you requested that your care partner not be given the details of your procedure findings, then the procedure report has been included in a sealed envelope for you to review at your convenience later.  YOU SHOULD EXPECT: Some feelings of bloating in the abdomen. Passage of more gas than usual.  Walking can help get rid of the air that was put into your GI tract during the procedure and reduce the bloating. If you had a lower endoscopy (such as a colonoscopy or flexible sigmoidoscopy) you may notice spotting of blood in your stool or on the toilet paper. If you underwent a bowel prep for your procedure, you may not have a normal bowel movement for a few days.  Please Note:  You might notice some irritation and congestion in your nose or some drainage.  This is from the oxygen used during your procedure.  There is no need for concern and it should clear up in a day or so.  SYMPTOMS TO REPORT IMMEDIATELY:     Following upper endoscopy (EGD)  Vomiting of blood or coffee ground material  New chest pain or pain under the shoulder blades  Painful or persistently difficult swallowing  New shortness of breath  Fever of 100F or higher  Black, tarry-looking stools  For urgent or emergent issues, a gastroenterologist can be reached at any hour  by calling [phone]. Do not use MyChart messaging for urgent concerns.    DIET:  We do recommend a small meal at first, but then you may proceed to your regular diet.  Drink plenty of fluids but you should avoid alcoholic beverages for 24 hours.  ACTIVITY:  You should plan to take it easy for the rest of today and you should NOT DRIVE or use heavy machinery until tomorrow (because of the sedation medicines used during the test).    FOLLOW UP: Our staff will call the number listed on your records 48-72 hours following your procedure to check on you and address any questions or concerns that you may have regarding the information given to you following your procedure. If we do not reach you, we will leave a message.  We will attempt to reach you two times.  During this call, we will ask if you have developed any symptoms of COVID 19. If you develop any symptoms (ie: fever, flu-like symptoms, shortness of breath, cough etc.) before then, please call [phone].  If you test positive for Covid 19 in the 2 weeks post procedure, please call and report this information to [place].    If any biopsies were taken you will be contacted by phone or by letter within the next 1-3 weeks.  Please call us at [phone] if you have not heard about the biopsies in 3 weeks.  SIGNATURES/CONFIDENTIALITY: You and/or your care partner have signed paperwork which will be entered into your electronic medical record.  These signatures attest to the fact that that the information above on your After Visit Summary has been reviewed and is understood.  Full responsibility of the confidentiality of this discharge information lies with you and/or your care-partner.

## 2020-01-04 NOTE — Progress Notes (Signed)
A and O x3. Report to RN. Tolerated MAC anesthesia well.Teeth unchanged after procedure.

## 2020-01-06 ENCOUNTER — Telehealth: Payer: Self-pay

## 2020-01-06 ENCOUNTER — Telehealth: Payer: Self-pay | Admitting: *Deleted

## 2020-01-06 NOTE — Telephone Encounter (Signed)
Attempted f/u phone call. No answer. Left message. °

## 2020-01-06 NOTE — Telephone Encounter (Signed)
Left message on 2nd follow up call. 

## 2020-01-13 IMAGING — CT CT ABD-PELV W/ CM
2 of 5 series · 16 of 46 positions shown, 18 images · IV contrast (iopamidol)
Comparison: None.

CLINICAL DATA: Abdominal pain.  Rule out appendicitis

EXAM:
CT ABDOMEN AND PELVIS WITH CONTRAST
TECHNIQUE: Multidetector CT imaging of the abdomen and pelvis was performed
using the standard protocol following bolus administration of
intravenous contrast.
CONTRAST:  100mL ZXTR79-UPP IOPAMIDOL (ZXTR79-UPP) INJECTION 61%

[Series 2: axial st · axial · 0.76mm/px · z∈[+750,+1110]mm · 13 of 82 slices shown, 15 images]
[im 5/82  soft-tissue]
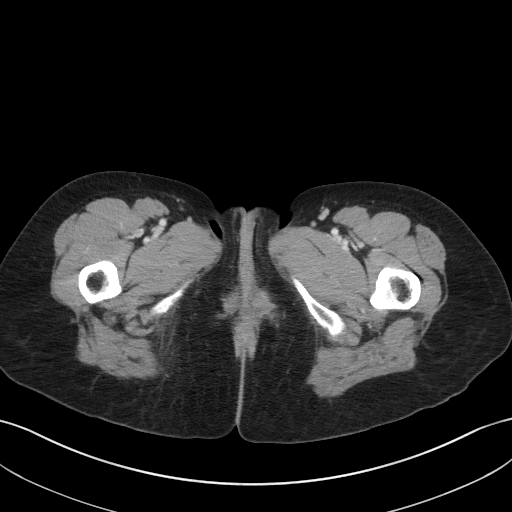
[im 5/82  bone]
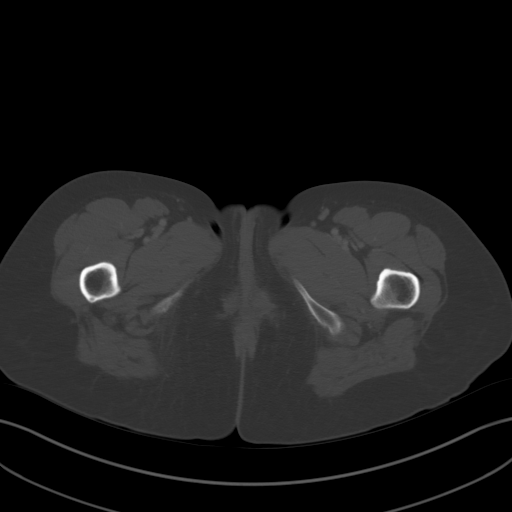
[im 10/82  soft-tissue]
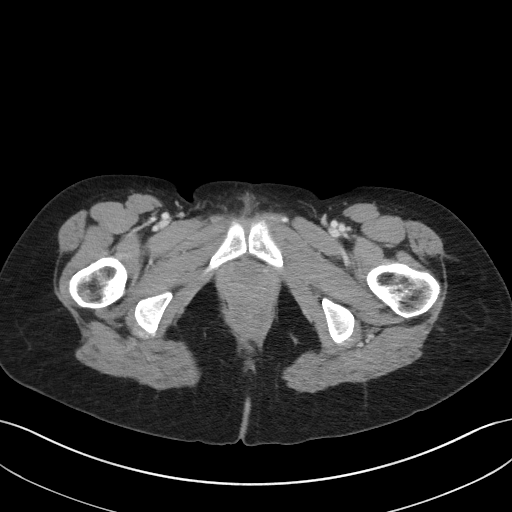
[im 19/82  soft-tissue]
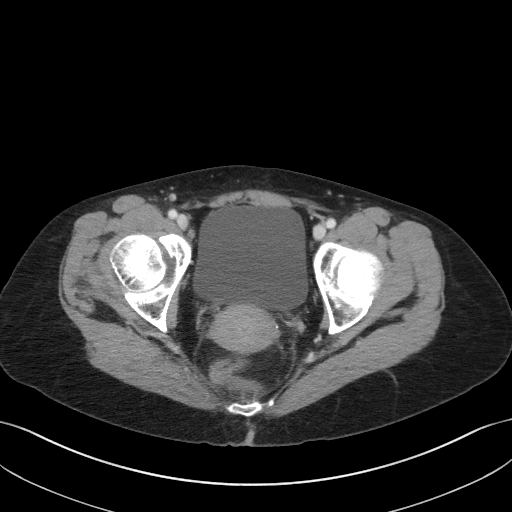
[im 23/82  soft-tissue]
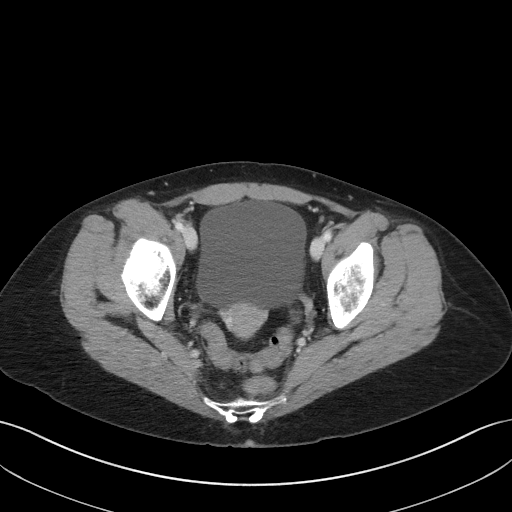
[im 28/82  soft-tissue]
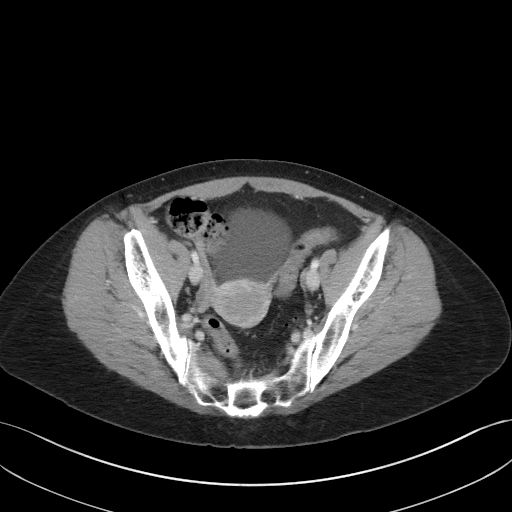
[im 37/82  soft-tissue]
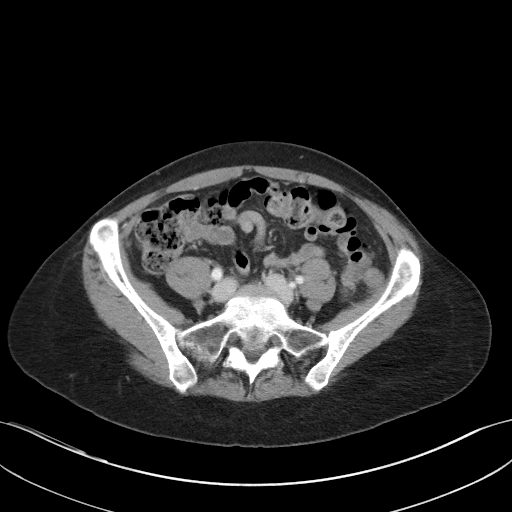
[im 41/82  soft-tissue]
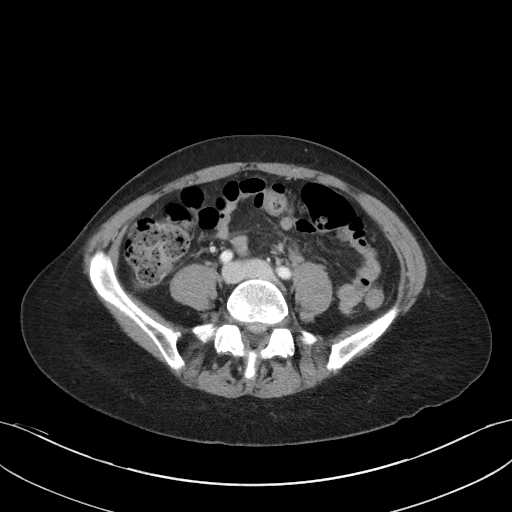
[im 46/82  soft-tissue]
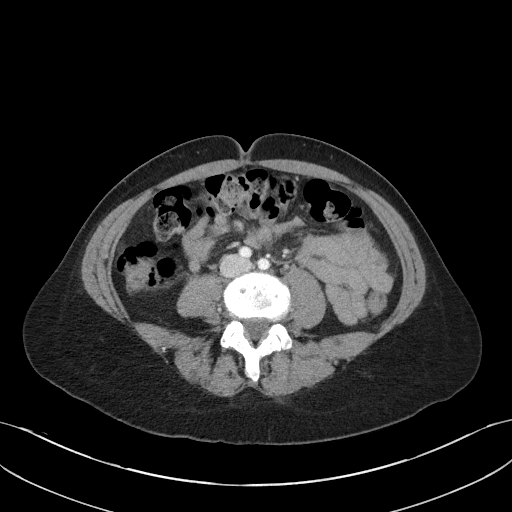
[im 55/82  soft-tissue]
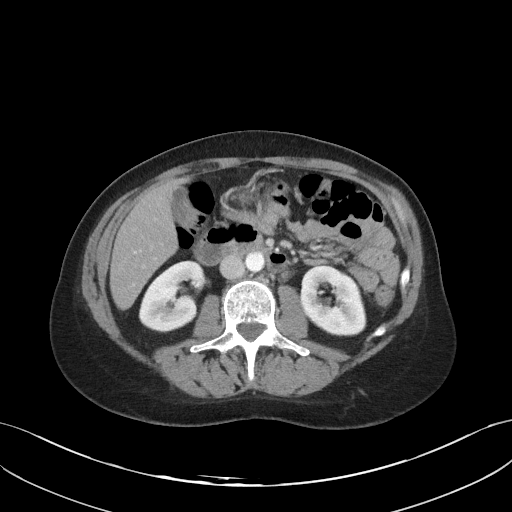
[im 55/82  bone]
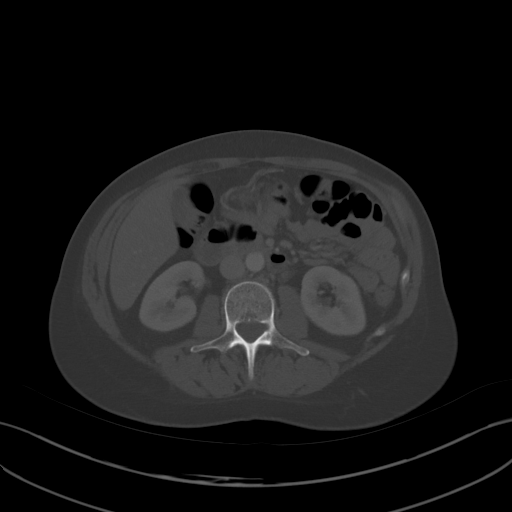
[im 59/82  soft-tissue]
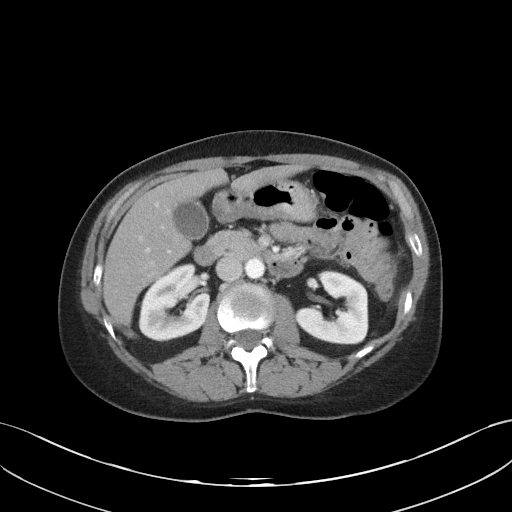
[im 64/82  soft-tissue]
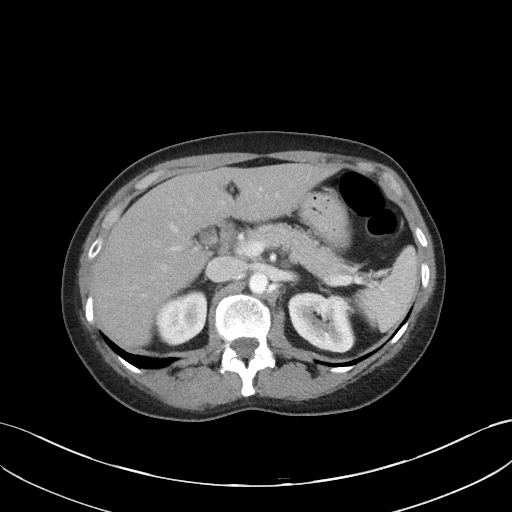
[im 73/82  soft-tissue]
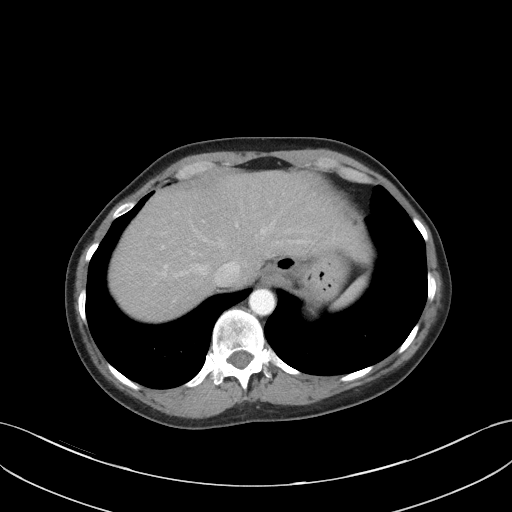
[im 77/82  soft-tissue]
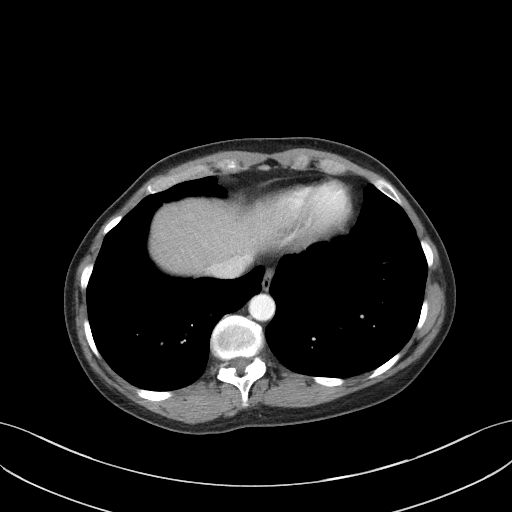

[Series 5: coronal st · coronal · 0.67mm/px · 3 of 77 slices shown]
[im 26/77  soft-tissue]
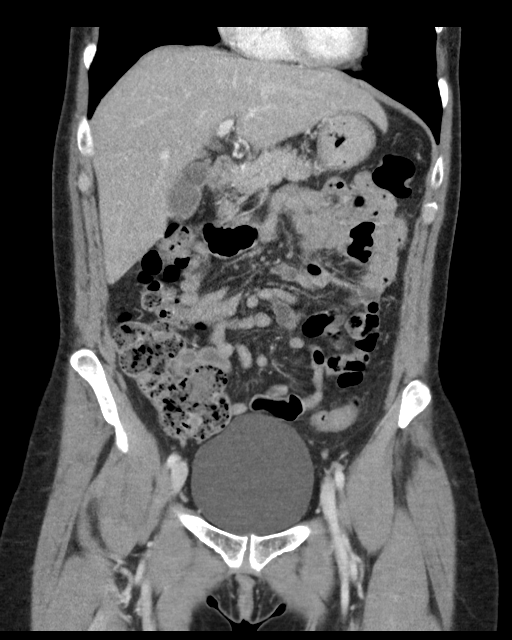
[im 34/77  soft-tissue]
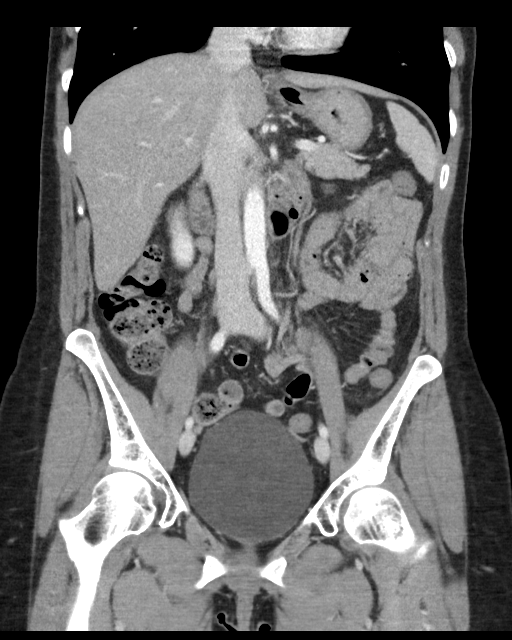
[im 43/77  soft-tissue]
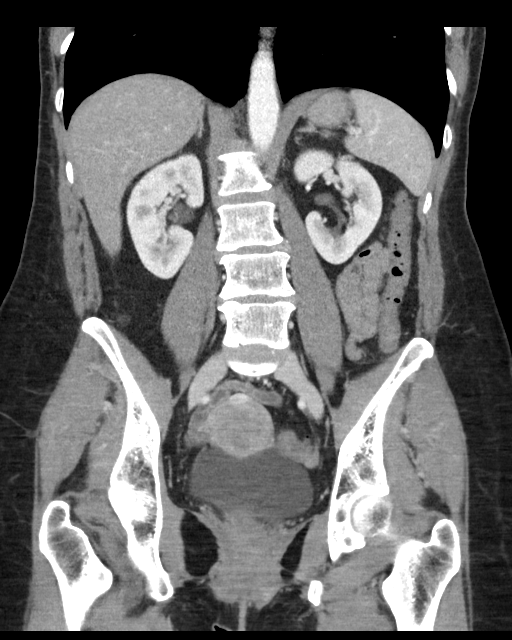

[16 of 46 positions shown; findings below may reference images not displayed]

FINDINGS: Lower chest: Lung bases clear bilaterally

Hepatobiliary: No focal liver abnormality is seen. No gallstones,
gallbladder wall thickening, or biliary dilatation.

Pancreas: Negative

Spleen: Negative

Adrenals/Urinary Tract: Adrenal glands are unremarkable. Kidneys are
normal, without renal calculi, focal lesion, or hydronephrosis.
Bladder is unremarkable.

Stomach/Bowel: Negative for bowel obstruction. Edema in the gastric
antrum could be due to gastritis or incomplete distention.

Appendix not visualized.  No evidence of acute appendicitis

Vascular/Lymphatic: No significant vascular findings are present. No
enlarged abdominal or pelvic lymph nodes.

Reproductive: Normal uterus.  No pelvic mass

Other: Negative for free fluid.  Negative for hernia

Musculoskeletal: No acute skeletal abnormality. Mild disc
degeneration L3-4 L4-5.
IMPRESSION: 1. Appendix not visualized.  No evidence of acute appendicitis
2. Thickening of the gastric antrum possible gastritis. Question
epigastric pain

## 2020-01-19 ENCOUNTER — Encounter: Payer: Self-pay | Admitting: Gastroenterology

## 2020-02-09 ENCOUNTER — Other Ambulatory Visit: Payer: Self-pay | Admitting: Gastroenterology

## 2024-02-29 ENCOUNTER — Other Ambulatory Visit: Payer: Self-pay | Admitting: Obstetrics and Gynecology

## 2024-02-29 DIAGNOSIS — Z136 Encounter for screening for cardiovascular disorders: Secondary | ICD-10-CM

## 2024-03-11 ENCOUNTER — Other Ambulatory Visit

## 2024-03-16 ENCOUNTER — Ambulatory Visit
Admission: RE | Admit: 2024-03-16 | Discharge: 2024-03-16 | Disposition: A | Discharge status: Home / Self Care | Source: Ambulatory Visit | Attending: Obstetrics and Gynecology | Admitting: Obstetrics and Gynecology

## 2024-03-16 DIAGNOSIS — Z136 Encounter for screening for cardiovascular disorders: Secondary | ICD-10-CM
